# Patient Record
Sex: Female | Born: 1980 | Race: White | Hispanic: Yes | Marital: Single | Smoking: Never smoker
Health system: Southern US, Community
[De-identification: ages and names within clinical notes are randomized; demographics above are authoritative.]

## PROBLEM LIST (undated history)

## (undated) DIAGNOSIS — I83811 Varicose veins of right lower extremities with pain: Secondary | ICD-10-CM

## (undated) HISTORY — DX: Varicose veins of right lower extremity with pain: I83.811

---

## 2010-06-03 HISTORY — PX: LAPAROSCOPIC NEPHRECTOMY: SUR781

## 2017-10-21 ENCOUNTER — Ambulatory Visit (INDEPENDENT_AMBULATORY_CARE_PROVIDER_SITE_OTHER): Payer: Self-pay | Admitting: Obstetrics & Gynecology

## 2017-10-21 ENCOUNTER — Encounter: Payer: Self-pay | Admitting: Obstetrics & Gynecology

## 2017-10-21 VITALS — BP 124/78 | Ht <= 58 in | Wt 142.0 lb

## 2017-10-21 DIAGNOSIS — Z113 Encounter for screening for infections with a predominantly sexual mode of transmission: Secondary | ICD-10-CM

## 2017-10-21 DIAGNOSIS — Z3041 Encounter for surveillance of contraceptive pills: Secondary | ICD-10-CM

## 2017-10-21 DIAGNOSIS — N83201 Unspecified ovarian cyst, right side: Secondary | ICD-10-CM

## 2017-10-21 DIAGNOSIS — R102 Pelvic and perineal pain: Secondary | ICD-10-CM

## 2017-10-21 DIAGNOSIS — N921 Excessive and frequent menstruation with irregular cycle: Secondary | ICD-10-CM

## 2017-10-21 MED ORDER — NORETHIN ACE-ETH ESTRAD-FE 1-20 MG-MCG(24) PO TABS: 1.0000 | ORAL_TABLET | Freq: Every day | ORAL | 0 refills | Status: DC

## 2017-10-21 NOTE — Progress Notes (Signed)
Shannon Barron [birth year] [id number]        37 y.o.  G4P4L4 Boyfriend.  Children 14, 13, 12 and 11.  RP: Right pelvic pain/Ovarian cyst and BTB on the Progestin-only pill  HPI: Complains of right pelvic pain for a few months.  Patient consulted before for this problem and had a pelvic ultrasound done on September 29, 2017.  The pelvic ultrasound showed a small right ovarian cyst measuring 3.3 cm with some internal echoes.  Also complains of breakthrough bleeding on the progestin only pill.  Rarely sexually active with boyfriend.  Relationship problems.  No urinary tract infection symptoms.  Bowel movements normal.  No fever.   OB History  Gravida Para Term Preterm AB Living  SAB TAB Ectopic Multiple Live Births               # Outcome Date GA Lbr Len/2nd Weight Sex Delivery Anes PTL Lv  4 Para           3 Para           2 Para           1 Para             Past medical history,surgical history, problem list, medications, allergies, family history and social history were all reviewed and documented in the EPIC chart.   Directed ROS with pertinent positives and negatives documented in the history of present illness/assessment and plan.  Exam:  Vitals:   10/21/17 0910  BP: 124/78  Weight: 142 lb (64.4 kg)  Height:  (1.473 m)   General appearance:  Normal  Abdomen: Normal, not distended, nontender.  Gynecologic exam: Vulva normal.  Speculum: Cervix and vagina normal.  Normal secretions.  Gono-Chlam done.  Bimanual exam: Uterus anteverted, normal volume, nontender.  No adnexal mass felt, nontender.  Pelvic US 09/29/2017:   IMPRESSION: Small probable hemorrhagic cyst within the right ovary. Otherwise unremarkable ultrasound.  Electronically Signed by: Eddie Candle  Other Result Information  This result has an attachment that is not available.  Result Narrative  ULTRASOUND OF THE PELVIS  INDICATION: Left lower quadrant pain  COMPARISON:  None  TECHNIQUE: Grayscale and color Doppler ultrasound was performed of the pelvis via transvaginal approach. Spectral analysis was also performed.  FINDINGS:   UTERUS: - Size: 9.9 x 4.6 x 5.3 cm. Anteverted. - No uterine masses.  ENDOMETRIUM: - Normal in thickness, measuring 0.6 cm.  RIGHT OVARY: - Size: 4.2 x 3.3 x 4.3 cm.  - There is a cystic lesion with some layering internal echoes within the right ovary measuring 3.3 x 3.1 x 3 cm. - Normal arterial and venous waveforms.  LEFT OVARY: - Size: 2.6 x 1.3 x 2.8 cm.  - No suspicious masses. - Normal arterial and venous waveforms.  BLADDER: - Unremarkable.  MISCELLANEOUS: - No free fluid in the pelvis.   Other Result Information  Acute Interface, Incoming Rad Results - 09/29/2017  3:42 PM EDT ULTRASOUND OF THE PELVIS  INDICATION: Left lower quadrant pain  COMPARISON: None  TECHNIQUE: Grayscale and color Doppler ultrasound was performed of the pelvis via transvaginal approach. Spectral analysis was also performed.  FINDINGS:   UTERUS: - Size: 9.9 x 4.6 x 5.3 cm. Anteverted. - No uterine masses.  ENDOMETRIUM: - Normal in thickness, measuring 0.6 cm.  RIGHT OVARY: - Size: 4.2 x 3.3 x 4.3 cm.  - There is a cystic lesion  with some layering internal echoes within the right ovary measuring 3.3 x 3.1 x 3 cm. - Normal arterial and venous waveforms.  LEFT OVARY: - Size: 2.6 x 1.3 x 2.8 cm.  - No suspicious masses. - Normal arterial and venous waveforms.  BLADDER: - Unremarkable.    MISCELLANEOUS: - No free fluid in the pelvis.    IMPRESSION: Small probable hemorrhagic cyst within the right ovary. Otherwise unremarkable ultrasound.     Assessment/Plan:  37 y.o. G4P4   1. Pelvic pain in female Intermittent right pelvic pain possibly associated with the right ovarian cyst seen on pelvic ultrasound September 29, 2017.  Will repeat a pelvic ultrasound here to reevaluate. - US PELVIS TRANSVANGINAL NON-OB (TV  ONLY); Future  2. Right ovarian cyst Right ovarian cyst measuring 3.3 cm with some internal echoes on pelvic ultrasound September 29, 2017.  Given the persistent intermittent right pelvic pain, will reevaluate by ultrasound this year. - US PELVIS TRANSVANGINAL NON-OB (TV ONLY); Future  3. Breakthrough bleeding on birth control pills Breakthrough bleeding on the progestin only pill.  No contraindication to change to an estrogen progestin pill.  Decision to start on norethindrone acetate ethynyl estradiol FE 1/20 (24).  Usage, risks and benefits reviewed.  Small risk of blood clots and increased blood pressure discussed. - US PELVIS TRANSVANGINAL NON-OB (TV ONLY); Future  4. Encounter for surveillance of contraceptive pills As above.  Will do Annual Gyn exam at f/u visit as well.  Other orders - Norethindrone Acetate-Ethinyl Estrad-FE (LOESTRIN 24 FE) 1-20 MG-MCG(24) tablet; Take 1 tablet by mouth daily.  Counseling on above issues and coordination of care more than 50% for 30 minutes.  Shannon Barron, 9:24 AM 10/21/2017

## 2017-10-22 LAB — C. TRACHOMATIS/N. GONORRHOEAE RNA
C. trachomatis RNA, TMA: DETECTED — AB
N. GONORRHOEAE RNA, TMA: NOT DETECTED

## 2017-10-23 ENCOUNTER — Other Ambulatory Visit: Payer: Self-pay | Admitting: Women's Health

## 2017-10-23 MED ORDER — AZITHROMYCIN 500 MG PO TABS: 1000.0000 mg | ORAL_TABLET | Freq: Once | ORAL | 0 refills | Status: AC

## 2017-10-25 ENCOUNTER — Encounter: Payer: Self-pay | Admitting: Obstetrics & Gynecology

## 2017-10-25 NOTE — Patient Instructions (Signed)
1. Pelvic pain in female Intermittent right pelvic pain possibly associated with the right ovarian cyst seen on pelvic ultrasound September 29, 2017.  Will repeat a pelvic ultrasound here to reevaluate. - US PELVIS TRANSVANGINAL NON-OB (TV ONLY); Future  2. Right ovarian cyst Right ovarian cyst measuring 3.3 cm with some internal echoes on pelvic ultrasound September 29, 2017.  Given the persistent intermittent right pelvic pain, will reevaluate by ultrasound this year. - US PELVIS TRANSVANGINAL NON-OB (TV ONLY); Future  3. Breakthrough bleeding on birth control pills Breakthrough bleeding on the progestin only pill.  No contraindication to change to an estrogen progestin pill.  Decision to start on norethindrone acetate ethynyl estradiol FE 1/20 (24).  Usage, risks and benefits reviewed.  Small risk of blood clots and increased blood pressure discussed. - US PELVIS TRANSVANGINAL NON-OB (TV ONLY); Future  4. Encounter for surveillance of contraceptive pills As above.  Will do Annual Gyn exam at f/u visit as well.  Other orders - Norethindrone Acetate-Ethinyl Estrad-FE (LOESTRIN 24 FE) 1-20 MG-MCG(24) tablet; Take 1 tablet by mouth daily.  Novali, it was a pleasure meeting you today!

## 2017-11-18 ENCOUNTER — Ambulatory Visit (INDEPENDENT_AMBULATORY_CARE_PROVIDER_SITE_OTHER): Payer: Self-pay | Admitting: Obstetrics & Gynecology

## 2017-11-18 ENCOUNTER — Ambulatory Visit (INDEPENDENT_AMBULATORY_CARE_PROVIDER_SITE_OTHER): Payer: Self-pay

## 2017-11-18 ENCOUNTER — Encounter: Payer: Self-pay | Admitting: Obstetrics & Gynecology

## 2017-11-18 VITALS — BP 126/84

## 2017-11-18 DIAGNOSIS — R102 Pelvic and perineal pain: Secondary | ICD-10-CM

## 2017-11-18 DIAGNOSIS — N921 Excessive and frequent menstruation with irregular cycle: Secondary | ICD-10-CM

## 2017-11-18 DIAGNOSIS — N83201 Unspecified ovarian cyst, right side: Secondary | ICD-10-CM

## 2017-11-18 DIAGNOSIS — I83811 Varicose veins of right lower extremities with pain: Secondary | ICD-10-CM

## 2017-11-18 NOTE — Progress Notes (Signed)
    Shannon Barron 30-Aug-1980 [id number]        37 y.o.  G4P4L4 Boyfriend  RP: Rt pelvic pain with Rt ovarian cyst for F/U Pelvic US  HPI: Improved Rt pelvic pain.  LMP normal 11/14/2017.  C/O painful large varicose veins in Rt leg.   OB History  Gravida Para Term Preterm AB Living  4 4       4   SAB TAB Ectopic Multiple Live Births               # Outcome Date GA Lbr Len/2nd Weight Sex Delivery Anes PTL Lv  4 Para           3 Para           2 Para           1 Para             Past medical history,surgical history, problem list, medications, allergies, family history and social history were all reviewed and documented in the EPIC chart.   Directed ROS with pertinent positives and negatives documented in the history of present illness/assessment and plan.  Exam:  Vitals:   11/18/17 1034  BP: 126/84   General appearance:  Normal  Pelvic US today: T/V images.  Anteverted uterus homogeneous measuring 9.52 x 5.86 x 4.32 cm.  Tri-layered endometrium measuring 4.4 mm.  Right ovary normal with previous cyst not seen.  Left ovary normal.  No apparent mass in the right or left adnexa.  No free fluid in the posterior cul-de-sac.   Assessment/Plan:  37 y.o. G4P4   1. Right ovarian cyst Resolved Rt Ovarian Cyst per today's US.  US findings reviewed, patient reassured.  2. Varicose veins of right lower extremity with pain Refer to the Vein clinic for management.  Message sent to organize.  Counseling on above issues and coordination of care more than 50% for 15 minutes.  Shannon DelMarie-Lyne Dannielle Baskins MD, 10:57 AM 11/18/2017

## 2017-11-19 ENCOUNTER — Telehealth: Payer: Self-pay | Admitting: *Deleted

## 2017-11-19 DIAGNOSIS — I83891 Varicose veins of right lower extremities with other complications: Secondary | ICD-10-CM

## 2017-11-19 NOTE — Telephone Encounter (Signed)
-----   Message from Genia DelMarie-Lyne Lavoie, MD sent at 11/18/2017 11:05 AM EDT ----- Regarding: Refer to Vein Clinic Rt lower limb large painful varicose veins.

## 2017-11-19 NOTE — Telephone Encounter (Signed)
Referral placed at [facility]  [place] they will call pt to schedule.

## 2017-11-20 ENCOUNTER — Other Ambulatory Visit: Payer: Self-pay

## 2017-11-20 DIAGNOSIS — I83891 Varicose veins of right lower extremities with other complications: Secondary | ICD-10-CM

## 2017-11-20 NOTE — Telephone Encounter (Signed)
pateint scheduled on 02/09/18

## 2017-11-23 ENCOUNTER — Encounter: Payer: Self-pay | Admitting: Obstetrics & Gynecology

## 2017-11-23 NOTE — Patient Instructions (Signed)
1. Right ovarian cyst Resolved Rt Ovarian Cyst per today's US.  US findings reviewed, patient reassured.  2. Varicose veins of right lower extremity with pain Refer to the Vein clinic for management.  Message sent to organize.  Laterra, fue un placer verle hoy!

## 2017-12-22 ENCOUNTER — Telehealth: Payer: Self-pay | Admitting: *Deleted

## 2017-12-22 NOTE — Telephone Encounter (Signed)
Patient called requesting a refill on Loestrin 24 FE 1/20 mcg, I just want to confirm if she needs annual exam I don't see patient has had one. Was a new patient on 10/21/17, she told claudia you told her to just call and you will refill, per note on 10/21/17 " Will do Annual Gyn exam at f/u visit as well." this was never done.    Please advise

## 2017-12-23 MED ORDER — NORETHIN ACE-ETH ESTRAD-FE 1-20 MG-MCG(24) PO TABS: 1.0000 | ORAL_TABLET | Freq: Every day | ORAL | 0 refills | Status: AC

## 2017-12-23 NOTE — Telephone Encounter (Signed)
Shannon CrapeClaudia can you have her schedule annual exam then I will send Rx for birth control pills to pharmacy.

## 2017-12-23 NOTE — Telephone Encounter (Signed)
Needs to schedule Annual/Gyn visit.  Prescribe BCP until that visit.

## 2017-12-23 NOTE — Telephone Encounter (Signed)
Patient scheduled for August 7 but needs 1 refill prior to that date.

## 2017-12-23 NOTE — Telephone Encounter (Signed)
Rx sent 

## 2018-01-07 ENCOUNTER — Encounter: Payer: Self-pay | Admitting: Obstetrics & Gynecology

## 2018-02-09 ENCOUNTER — Ambulatory Visit (HOSPITAL_COMMUNITY)
Admission: RE | Admit: 2018-02-09 | Discharge: 2018-02-09 | Disposition: A | Discharge status: Home / Self Care | Payer: Self-pay | Source: Ambulatory Visit | Attending: Surgery | Admitting: Surgery

## 2018-02-09 ENCOUNTER — Other Ambulatory Visit: Payer: Self-pay

## 2018-02-09 ENCOUNTER — Encounter: Payer: Self-pay | Admitting: Surgery

## 2018-02-09 ENCOUNTER — Ambulatory Visit (INDEPENDENT_AMBULATORY_CARE_PROVIDER_SITE_OTHER): Payer: Self-pay | Admitting: Surgery

## 2018-02-09 VITALS — Resp 14 | Ht <= 58 in | Wt 147.0 lb

## 2018-02-09 DIAGNOSIS — I83891 Varicose veins of right lower extremities with other complications: Secondary | ICD-10-CM

## 2018-02-09 DIAGNOSIS — R609 Edema, unspecified: Secondary | ICD-10-CM | POA: Insufficient documentation

## 2018-02-09 NOTE — Progress Notes (Signed)
[facility]  Patient name: Shannon Barron MRN: [id number] DOB: 1980/06/04 Sex: female   REQUESTING PROVIDER:    Dr. Seymour Bars   REASON FOR CONSULT:    Varicose veins with pain  HISTORY OF PRESENT ILLNESS:   Shannon Barron is a 37 y.o. female, who is here today for evaluation of painful varicose veins particular in the right leg.  These have been present for 11-12 years.  She feels like they have gotten thicker and are more visually apparent.  They have become very painful.  At the end of the day when they were swollen they itch.  She uses a cream to place on them when they are bothering her and this seems to help.  She does have bilateral leg swelling.  She has never worn compression.  She denies a history of DVT.  There are no family history of DVT.  PAST MEDICAL HISTORY    Past Medical History:  Diagnosis Date  . Varicose veins of leg with pain, right      FAMILY HISTORY   Family History  Problem Relation Age of Onset  . Migraines Sister   . Gout Father     SOCIAL HISTORY:   Social History   Socioeconomic History  . Marital status: Single    Spouse name: Not on file  . Number of children: Not on file  . Years of education: Not on file  . Highest education level: Not on file  Occupational History  . Not on file  Social Needs  . Financial resource strain: Not on file  . Food insecurity:    Worry: Not on file    Inability: Not on file  . Transportation needs:    Medical: Not on file    Non-medical: Not on file  Tobacco Use  . Smoking status: Never Smoker  . Smokeless tobacco: Never Used  Substance and Sexual Activity  . Alcohol use: Never    Frequency: Never  . Drug use: Not on file  . Sexual activity: Yes    Partners: Male    Birth control/protection: Pill    Comment: 1st intercourse- 21, partners- 2, current partner- 2 yrs   Lifestyle  . Physical activity:    Days per week: Not on  file    Minutes per session: Not on file  . Stress: Not on file  Relationships  . Social connections:    Talks on phone: Not on file    Gets together: Not on file    Attends religious service: Not on file    Active member of club or organization: Not on file    Attends meetings of clubs or organizations: Not on file    Relationship status: Not on file  . Intimate partner violence:    Fear of current or ex partner: Not on file    Emotionally abused: Not on file    Physically abused: Not on file    Forced sexual activity: Not on file  Other Topics Concern  . Not on file  Social History Narrative  . Not on file    ALLERGIES:    No Known Allergies  CURRENT MEDICATIONS:    Current Outpatient Medications  Medication Sig Dispense Refill  . Norethindrone Acetate-Ethinyl Estrad-FE (LOESTRIN 24 FE) 1-20 MG-MCG(24) tablet Take 1 tablet by mouth daily. 3 Package 0  . famotidine (PEPCID) 20 MG tablet Take by mouth.    . ranitidine (ZANTAC) 150 MG capsule Take by mouth.  No current facility-administered medications for this visit.     REVIEW OF SYSTEMS:   [X]  denotes positive finding, [ ]  denotes negative finding Cardiac  Comments:  Chest pain or chest pressure:    Shortness of breath upon exertion:    Short of breath when lying flat:    Irregular heart rhythm:        Vascular    Pain in calf, thigh, or hip brought on by ambulation:    Pain in feet at night that wakes you up from your sleep:     Blood clot in your veins:    Leg swelling:  x       Pulmonary    Oxygen at home:    Productive cough:     Wheezing:         Neurologic    Sudden weakness in arms or legs:     Sudden numbness in arms or legs:     Sudden onset of difficulty speaking or slurred speech:    Temporary loss of vision in one eye:     Problems with dizziness:         Gastrointestinal    Blood in stool:      Vomited blood:         Genitourinary    Burning when urinating:     Blood in urine:         Psychiatric    Major depression:         Hematologic    Bleeding problems:    Problems with blood clotting too easily:        Skin    Rashes or ulcers:        Constitutional    Fever or chills:     PHYSICAL EXAM:   Vitals:   02/09/18 1448  Resp: 14  Weight: 147 lb (66.7 kg)  Height: 4\' 10"  (1.473 m)    GENERAL: The patient is a well-nourished female, in no acute distress. The vital signs are documented above. CARDIAC: There is a regular rate and rhythm.  VASCULAR: Trace bilateral edema.  Varicosity noted on the posterior right lateral leg at the level of the knee PULMONARY: Nonlabored respirations ABDOMEN: Soft and non-tender with normal pitched bowel sounds.  MUSCULOSKELETAL: There are no major deformities or cyanosis. NEUROLOGIC: No focal weakness or paresthesias are detected. SKIN: There are no ulcers or rashes noted. PSYCHIATRIC: The patient has a normal affect.  STUDIES:   I have ordered and reviewed her vascular lab venous insufficiency study.  She has reflux in the right great saphenous vein extending into the saphenofemoral junction.  The vein diameters range from 0.4-0.63 cm  ASSESSMENT and PLAN   CEAP class III: I am going to place the patient in 20-30 thigh-high compression stockings.  She will wear these for a period of 3 months and then return for further evaluation.  We discussed that she may be a candidate for laser ablation and phlebectomy of the prominent varicosity that is bothering her.  She also understands that no procedure is required if she receives adequate benefit from the compression stockings.  She will be scheduled for follow-up in 3 months   Durene Cal, MD [facility] [phone] Pager [phone]

## 2018-04-16 ENCOUNTER — Encounter: Payer: Self-pay | Admitting: Obstetrics & Gynecology

## 2018-04-16 DIAGNOSIS — Z0289 Encounter for other administrative examinations: Secondary | ICD-10-CM

## 2018-05-13 ENCOUNTER — Ambulatory Visit: Payer: Self-pay | Admitting: Vascular Surgery

## 2018-07-15 ENCOUNTER — Other Ambulatory Visit: Payer: Self-pay

## 2018-07-15 ENCOUNTER — Ambulatory Visit (INDEPENDENT_AMBULATORY_CARE_PROVIDER_SITE_OTHER): Payer: Self-pay | Admitting: Vascular Surgery

## 2018-07-15 ENCOUNTER — Encounter: Payer: Self-pay | Admitting: Vascular Surgery

## 2018-07-15 VITALS — BP 109/70 | HR 71 | Temp 97.8°F | Resp 16 | Ht <= 58 in | Wt 147.0 lb

## 2018-07-15 DIAGNOSIS — I83811 Varicose veins of right lower extremities with pain: Secondary | ICD-10-CM

## 2018-07-15 NOTE — Progress Notes (Signed)
Patient name: Shannon Barron MRN: [id number] DOB: [birth year] Sex: female  HPI: Interview conducted today through the assistance of a [place] provided interpreter Shannon Barron is a 38 y.o. female, who returns for follow-up today after 3 months of compression therapy to try to improve symptoms of varicose veins in the right leg.  She complains primarily of pain over a varicose vein in the right posterior calf.  She also complains of heaviness achiness fullness and swelling that becomes worse as the day progresses.  She has had some relief but not complete resolution of her symptoms with compression therapy.  She has no prior history of DVT.  She has no prior history of skin breakdown.    Review of systems: She denies shortness of breath.  She denies chest pain.  Past Medical History:  Diagnosis Date  . Varicose veins of leg with pain, right    Past Surgical History:  Procedure Laterality Date  . LAPAROSCOPIC NEPHRECTOMY Right 2012   [facility]    Family History  Problem Relation Age of Onset  . Migraines Sister   . Gout Father     SOCIAL HISTORY: Social History   Socioeconomic History  . Marital status: Single    Spouse name: Not on file  . Number of children: Not on file  . Years of education: Not on file  . Highest education level: Not on file  Occupational History  . Not on file  Social Needs  . Financial resource strain: Not on file  . Food insecurity:    Worry: Not on file    Inability: Not on file  . Transportation needs:    Medical: Not on file    Non-medical: Not on file  Tobacco Use  . Smoking status: Never Smoker  . Smokeless tobacco: Never Used  Substance and Sexual Activity  . Alcohol use: Never    Frequency: Never  . Drug use: Not on file  . Sexual activity: Yes    Partners: Male    Birth control/protection: Pill    Comment: 1st intercourse- 21, partners- 2, current partner- 2 yrs   Lifestyle  . Physical activity:    Days per week:  Not on file    Minutes per session: Not on file  . Stress: Not on file  Relationships  . Social connections:    Talks on phone: Not on file    Gets together: Not on file    Attends religious service: Not on file    Active member of club or organization: Not on file    Attends meetings of clubs or organizations: Not on file    Relationship status: Not on file  . Intimate partner violence:    Fear of current or ex partner: Not on file    Emotionally abused: Not on file    Physically abused: Not on file    Forced sexual activity: Not on file  Other Topics Concern  . Not on file  Social History Narrative  . Not on file    No Known Allergies  Current Outpatient Medications  Medication Sig Dispense Refill  . famotidine (PEPCID) 20 MG tablet Take by mouth.    . Norethindrone Acetate-Ethinyl Estrad-FE (LOESTRIN 24 FE) 1-20 MG-MCG(24) tablet Take 1 tablet by mouth daily. 3 Package 0  . ranitidine (ZANTAC) 150 MG capsule Take by mouth.     No current facility-administered medications for this visit.     Physical Examination  Vitals:   07/15/18 0850  BP: 109/70  Pulse: 71  Resp: 16  Temp: 97.8 F (36.6 C)  TempSrc: Oral  SpO2: 98%  Weight: 147 lb (66.7 kg)  Height: 4\' 10"  (1.473 m)    Body mass index is 30.72 kg/m.  General:  Alert and oriented, no acute distress HEENT: Normal Neck: No JVD Pulmonary: Clear to auscultation bilaterally Cardiac: Regular Rate and Rhythm  Skin: No rash Musculoskeletal: No deformity trace right pretibial edema  Neurologic: Upper and lower extremity motor 5/5 and symmetric  DATA:  I performed a SonoSite exam of the patient's right leg today which shows a 3 to 4 mm right greater saphenous vein fairly uniform diameter although smaller at the knee level  ASSESSMENT: Symptomatic varicose veins with pain and swelling.  Patient has had partial relief of her symptoms with compression therapy alone.  At this point she has opted for continued  compression and observation to see if her symptoms will continue to improve.  She will consider laser ablation at some point in the future if her symptoms worsen.   PLAN: See above   Shannon Brunsharles Michaeljames Milnes, MD [facility] [place] Office: [phone] Pager: [phone]

## 2019-04-12 ENCOUNTER — Other Ambulatory Visit (HOSPITAL_COMMUNITY): Payer: Self-pay | Admitting: Urology

## 2019-04-12 DIAGNOSIS — N281 Cyst of kidney, acquired: Secondary | ICD-10-CM

## 2019-05-05 ENCOUNTER — Ambulatory Visit (HOSPITAL_COMMUNITY)
Admission: RE | Admit: 2019-05-05 | Discharge: 2019-05-05 | Disposition: A | Discharge status: Home / Self Care | Payer: Self-pay | Source: Ambulatory Visit | Attending: Urology | Admitting: Urology

## 2019-05-05 ENCOUNTER — Other Ambulatory Visit: Payer: Self-pay

## 2019-05-05 ENCOUNTER — Encounter (HOSPITAL_COMMUNITY): Payer: Self-pay

## 2019-05-05 DIAGNOSIS — N281 Cyst of kidney, acquired: Secondary | ICD-10-CM | POA: Insufficient documentation

## 2019-05-05 MED ORDER — SODIUM CHLORIDE (PF) 0.9 % IJ SOLN
INTRAMUSCULAR | Status: AC
  Filled 2019-05-05: qty 50

## 2019-05-05 MED ORDER — IOHEXOL 300 MG/ML  SOLN: 100.0000 mL | Freq: Once | INTRAMUSCULAR | Status: DC | PRN

## 2019-05-05 MED ORDER — IOHEXOL 350 MG/ML SOLN
100.0000 mL | Freq: Once | INTRAVENOUS | Status: AC | PRN
  Administered 2019-05-05: 100 mL via INTRAVENOUS

## 2020-11-10 IMAGING — CT CT ABD-PEL WO/W CM
3 of 12 series · 11 of 46 positions shown, 17 images · IV contrast (omnipaque)
Comparison: None.

CLINICAL DATA: Left-sided pain, history of nephrectomy on the
right. Nephrectomy per outside report from [HOSPITAL] 4 XGP period

EXAM:
CT ABDOMEN AND PELVIS WITHOUT AND WITH CONTRAST
TECHNIQUE: Multidetector CT imaging of the abdomen and pelvis was performed
following the standard protocol before and following the bolus
administration of intravenous contrast.
CONTRAST:  100mL OMNIPAQUE IOHEXOL 350 MG/ML SOLN

[Series 2: axial pre · axial · non-contrast · 0.78mm/px · z∈[+1355,+1421]mm · 2 of 90 slices shown]
[im 23/90  soft-tissue]
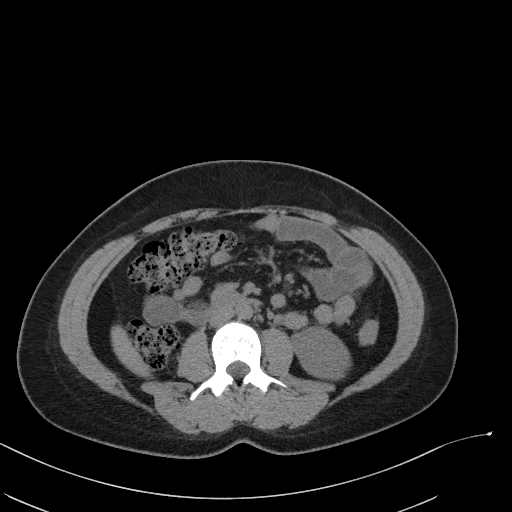
[im 45/90  soft-tissue]
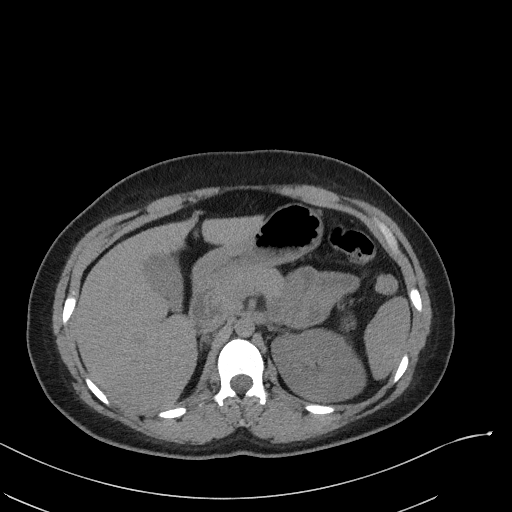

[Series 3: coronal pre · coronal · non-contrast · 0.58mm/px · 2 of 74 slices shown, 3 images]
[im 25/74  soft-tissue]
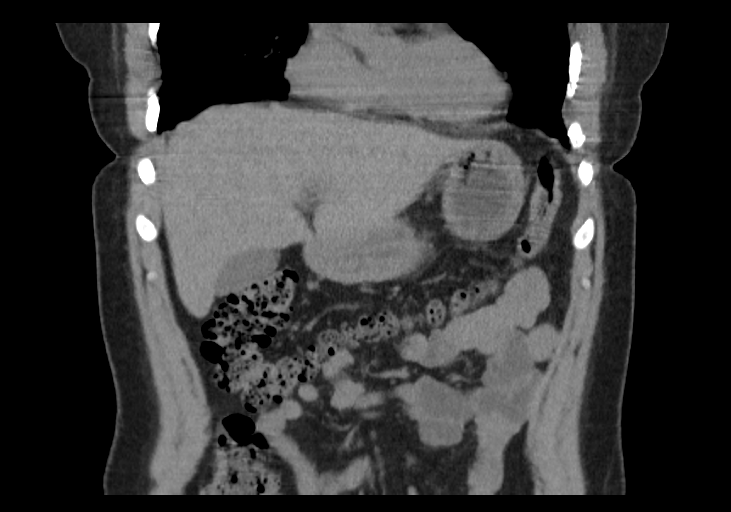
[im 25/74  bone]
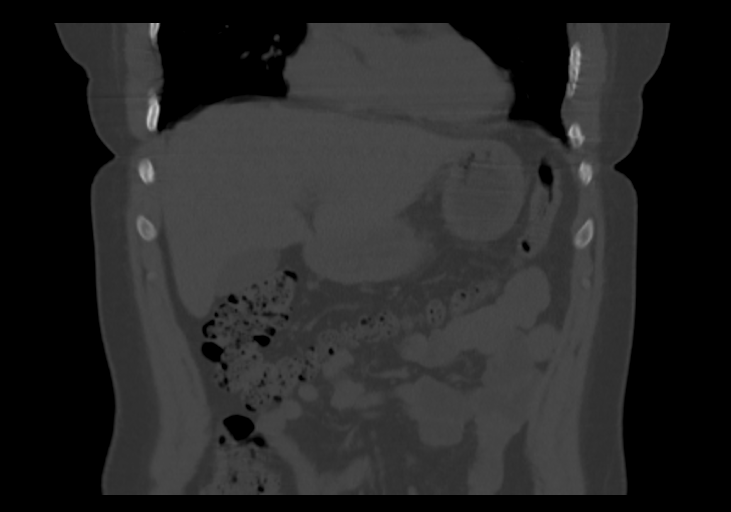
[im 49/74  soft-tissue]
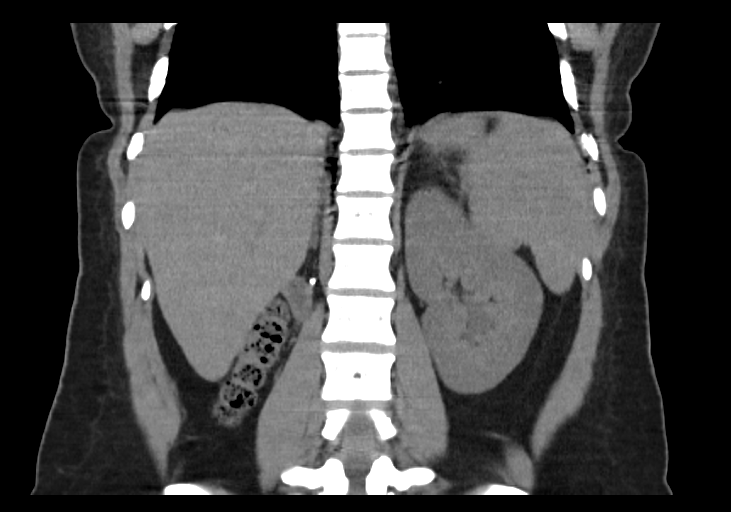

[Series 11: axial nephro · axial · 0.76mm/px · z∈[+1161,+1503]mm · 7 of 153 slices shown, 12 images]
[im 20/153  soft-tissue]
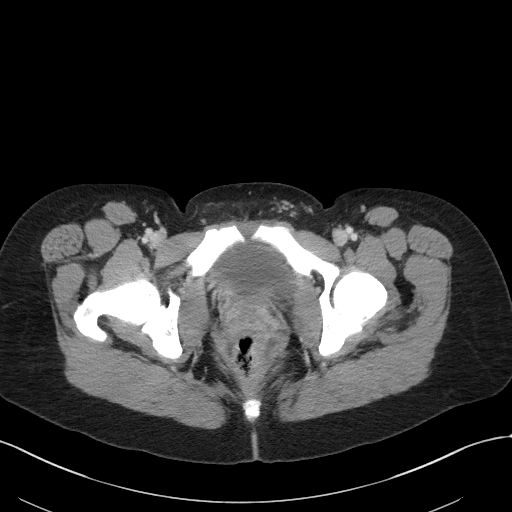
[im 20/153  bone]
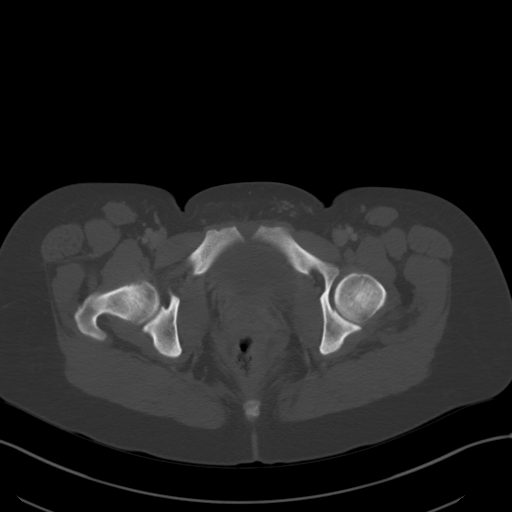
[im 39/153  soft-tissue]
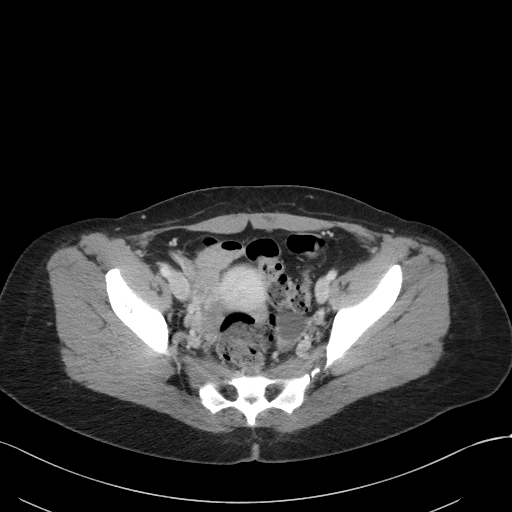
[im 58/153  soft-tissue]
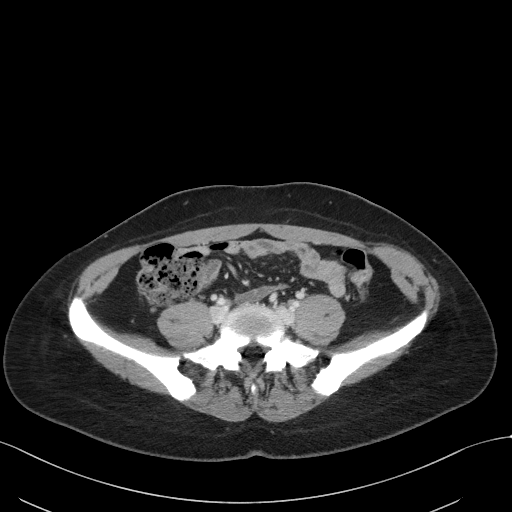
[im 77/153  soft-tissue]
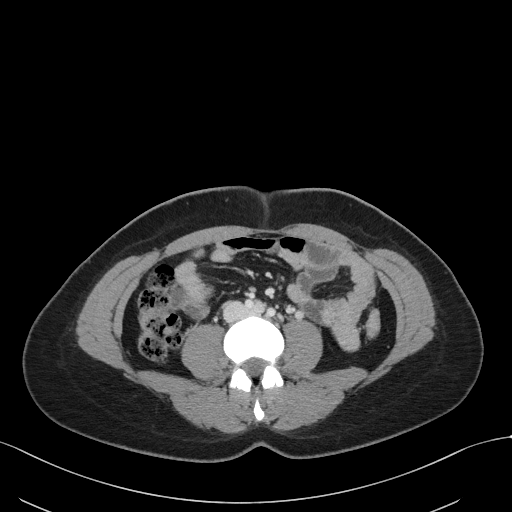
[im 77/153  lung]
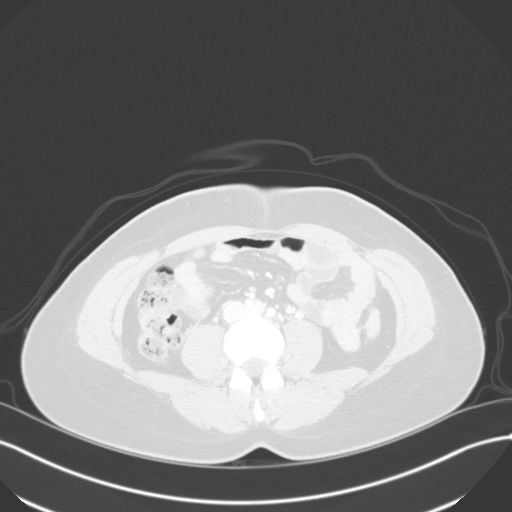
[im 96/153  soft-tissue]
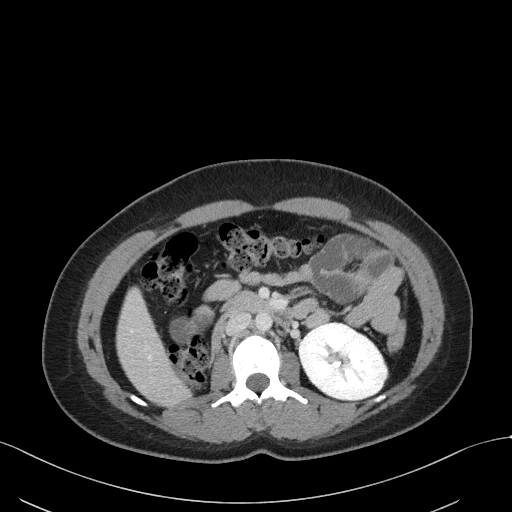
[im 96/153  lung]
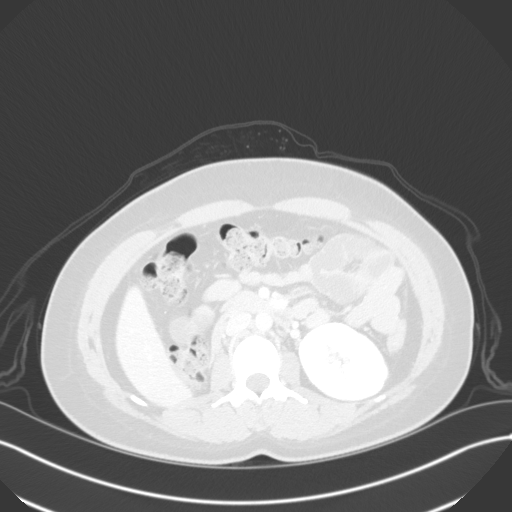
[im 115/153  soft-tissue]
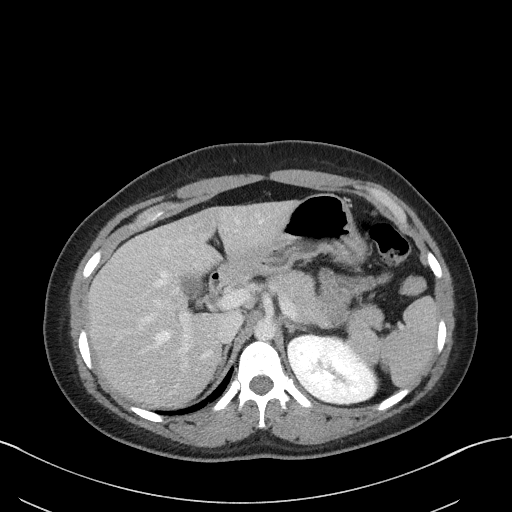
[im 115/153  lung]
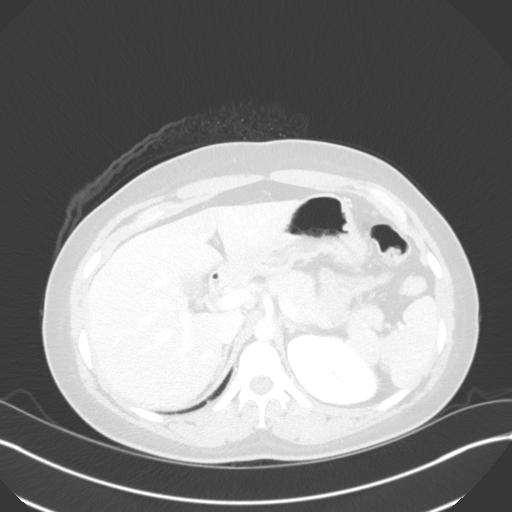
[im 134/153  soft-tissue]
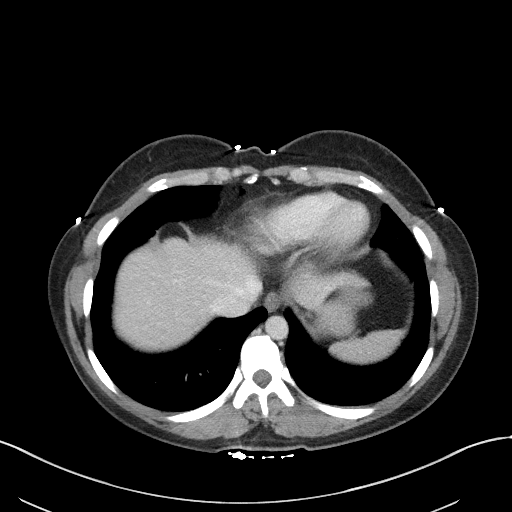
[im 134/153  lung]
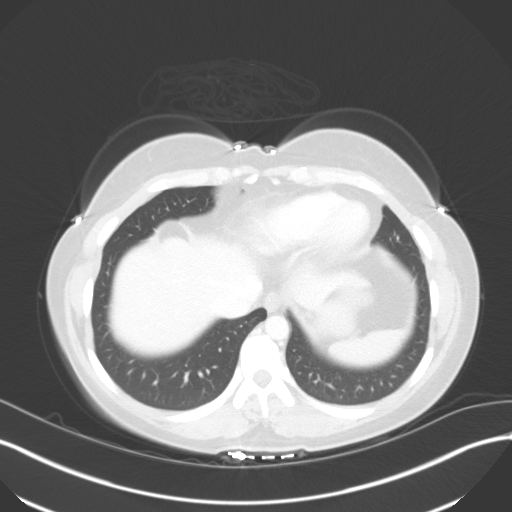

[11 of 46 positions shown; findings below may reference images not displayed]

FINDINGS: Lower chest: No signs of consolidation or evidence of pleural
effusion.

Hepatobiliary: No signs of focal hepatic lesion. No biliary ductal
dilation. Gallbladder is normal.

Pancreas: Unremarkable. No pancreatic ductal dilatation or
surrounding inflammatory changes.

Spleen: Normal in size without focal abnormality.

Adrenals/Urinary Tract: Normal adrenal glands.

Post right nephrectomy.

Calcification in the upper pole of the left kidney measuring 9 by 6
mm, perhaps in a peripheral calyx with some scarring overlying the
calcification and a small left renal cysts. No significant
perinephric stranding. No signs of hydronephrosis.

Stomach/Bowel: The appendix is normal. No signs of bowel
obstruction.

Vascular/Lymphatic: Patent abdominal vasculature. No significant
atherosclerosis. No signs of abdominal adenopathy. No signs of
pelvic lymphadenopathy.

Reproductive: Normal CT appearance of the uterus. Adnexal cysts
bilaterally.

Other: No signs of free air or ascites. No significant hernia.

Musculoskeletal: No signs of acute bone finding or evidence of
destructive bone process.
IMPRESSION: 1. No acute findings in the abdomen or pelvis.
2. Post right nephrectomy.
3. Presumed caliceal stone in the upper pole of the left kidney with
overlying scarring. No signs of stranding or hydronephrosis.
